# Patient Record
Sex: Male | Born: 1980 | Race: White | Hispanic: No | Smoking: Never smoker
Health system: Southern US, Community
[De-identification: ages and names within clinical notes are randomized; demographics above are authoritative.]

## PROBLEM LIST (undated history)

## (undated) DIAGNOSIS — M1990 Unspecified osteoarthritis, unspecified site: Secondary | ICD-10-CM

## (undated) DIAGNOSIS — Z87442 Personal history of urinary calculi: Secondary | ICD-10-CM

## (undated) DIAGNOSIS — J45909 Unspecified asthma, uncomplicated: Secondary | ICD-10-CM

## (undated) DIAGNOSIS — Z8489 Family history of other specified conditions: Secondary | ICD-10-CM

## (undated) HISTORY — PX: HAND EXPLORATION: SHX1725

---

## 2012-03-27 DIAGNOSIS — M25861 Other specified joint disorders, right knee: Secondary | ICD-10-CM | POA: Insufficient documentation

## 2012-08-17 ENCOUNTER — Emergency Department: Payer: Self-pay | Admitting: Internal Medicine

## 2014-06-23 DIAGNOSIS — R7989 Other specified abnormal findings of blood chemistry: Secondary | ICD-10-CM | POA: Insufficient documentation

## 2014-07-18 DIAGNOSIS — G4733 Obstructive sleep apnea (adult) (pediatric): Secondary | ICD-10-CM | POA: Insufficient documentation

## 2014-10-20 DIAGNOSIS — E7800 Pure hypercholesterolemia, unspecified: Secondary | ICD-10-CM | POA: Insufficient documentation

## 2016-06-07 ENCOUNTER — Emergency Department: Payer: Worker's Compensation

## 2016-06-07 ENCOUNTER — Emergency Department
Admission: EM | Admit: 2016-06-07 | Discharge: 2016-06-07 | Disposition: A | Discharge status: Home / Self Care | Payer: Worker's Compensation | Attending: Emergency Medicine | Admitting: Emergency Medicine

## 2016-06-07 ENCOUNTER — Encounter: Payer: Self-pay | Admitting: Emergency Medicine

## 2016-06-07 DIAGNOSIS — J45909 Unspecified asthma, uncomplicated: Secondary | ICD-10-CM | POA: Insufficient documentation

## 2016-06-07 DIAGNOSIS — Y990 Civilian activity done for income or pay: Secondary | ICD-10-CM | POA: Diagnosis not present

## 2016-06-07 DIAGNOSIS — S61411A Laceration without foreign body of right hand, initial encounter: Secondary | ICD-10-CM | POA: Insufficient documentation

## 2016-06-07 DIAGNOSIS — Y929 Unspecified place or not applicable: Secondary | ICD-10-CM | POA: Insufficient documentation

## 2016-06-07 DIAGNOSIS — Z23 Encounter for immunization: Secondary | ICD-10-CM | POA: Insufficient documentation

## 2016-06-07 DIAGNOSIS — Y9389 Activity, other specified: Secondary | ICD-10-CM | POA: Diagnosis not present

## 2016-06-07 DIAGNOSIS — S6991XA Unspecified injury of right wrist, hand and finger(s), initial encounter: Secondary | ICD-10-CM | POA: Diagnosis present

## 2016-06-07 DIAGNOSIS — Z87891 Personal history of nicotine dependence: Secondary | ICD-10-CM | POA: Diagnosis not present

## 2016-06-07 DIAGNOSIS — W11XXXA Fall on and from ladder, initial encounter: Secondary | ICD-10-CM | POA: Diagnosis not present

## 2016-06-07 HISTORY — DX: Unspecified asthma, uncomplicated: J45.909

## 2016-06-07 MED ORDER — LIDOCAINE HCL 2 % IJ SOLN: 20.0000 mL | Freq: Once | INTRAMUSCULAR | Status: DC

## 2016-06-07 MED ORDER — LIDOCAINE HCL (PF) 1 % IJ SOLN
20.0000 mL | Freq: Once | INTRAMUSCULAR | Status: DC
Start: 2016-06-07 — End: 2016-06-07

## 2016-06-07 MED ORDER — CEPHALEXIN 500 MG PO CAPS: 1000.0000 mg | ORAL_CAPSULE | Freq: Two times a day (BID) | ORAL | 0 refills | Status: AC

## 2016-06-07 MED ORDER — LIDOCAINE HCL (PF) 1 % IJ SOLN
INTRAMUSCULAR | Status: AC
  Filled 2016-06-07: qty 20

## 2016-06-07 MED ORDER — TETANUS-DIPHTH-ACELL PERTUSSIS 5-2.5-18.5 LF-MCG/0.5 IM SUSP
0.5000 mL | Freq: Once | INTRAMUSCULAR | Status: AC
  Administered 2016-06-07: 0.5 mL via INTRAMUSCULAR
  Filled 2016-06-07: qty 0.5

## 2016-06-07 MED ORDER — HYDROCODONE-ACETAMINOPHEN 5-325 MG PO TABS
2.0000 | ORAL_TABLET | Freq: Once | ORAL | Status: AC
  Administered 2016-06-07: 2 via ORAL
  Filled 2016-06-07: qty 2

## 2016-06-07 MED ORDER — HYDROCODONE-ACETAMINOPHEN 5-325 MG PO TABS: 1.0000 | ORAL_TABLET | Freq: Four times a day (QID) | ORAL | 0 refills | Status: DC | PRN

## 2016-06-07 NOTE — Discharge Instructions (Signed)
Take motrin for pain.   Take vicodin for severe pain.   Take keflex to prevent infection.   Suture removal in a week   Return to ER if you have severe hand pain, hand redness and swelling, fingers turning blue, fevers, purulent drainage

## 2016-06-07 NOTE — ED Provider Notes (Signed)
[facility]-EMERGENCY DEPARTMENT Provider Note   CSN: [id number] Arrival date & time: 06/07/16  0945     History   Chief Complaint Chief Complaint  Patient presents with  . Laceration    HPI Adam Yeldell. is a 36 y.o. male was healthy here presenting with laceration. Patient states that he was at work and was on the ladder and accidentally fell and his right hand went through the fish tank. He is noted to have a large laceration of his right hand afterwards and the bleeding was controlled with some pressure. Patient denies any numbness to the fingers. Patient states that his tetanus is not up-to-date.  The history is provided by the patient.    Past Medical History:  Diagnosis Date  . Asthma     There are no active problems to display for this patient.   History reviewed. No pertinent surgical history.     Home Medications    Prior to Admission medications   Not on File    Family History History reviewed. No pertinent family history.  Social History Social History  Substance Use Topics  . Smoking status: Never Smoker  . Smokeless tobacco: Former Neurosurgeon    Types: Chew    Quit date: 02/28/2011  . Alcohol use Yes     Comment: OCCASional     Allergies   Patient has no known allergies.   Review of Systems Review of Systems  Skin: Positive for wound.  All other systems reviewed and are negative.    Physical Exam Updated Vital Signs BP (!) 145/86 (BP Location: Left Arm)   Pulse 96   Temp 97.9 F (36.6 C) (Oral)   Resp 18   Ht 6' (1.829 m)   Wt 190 lb (86.2 kg)   SpO2 98%   BMI 25.77 kg/m   Physical Exam  Constitutional: He appears well-developed.  HENT:  Head: Normocephalic.  Eyes: Pupils are equal, round, and reactive to light.  Neck: Normal range of motion.  Cardiovascular: Normal rate.   Pulmonary/Chest: Effort normal.  Abdominal: Soft.  Musculoskeletal:  10 cm laceration palmer side of R hand on the R 5th metacarpal bone. 8 cm  laceration dorsal side L hand R 5th metacarpal bone. There is fat exposed, no obvious tendons. Able to flex and extend R 5th finger, nl capillary refill, no obvious bony tenderness. Nl ROM R wrist, 2+ radial pulses   Neurological: He is alert.  Skin: Skin is warm.  Psychiatric: He has a normal mood and affect.  Nursing note and vitals reviewed.    ED Treatments / Results  Labs (all labs ordered are listed, but only abnormal results are displayed) Labs Reviewed - No data to display  EKG  EKG Interpretation None       Radiology Dg Hand Complete Right  Result Date: 06/07/2016 CLINICAL DATA:  Laceration to right hand along 5th metacarpal after falling onto glass fish tank today. Hand was wrapped in gauze by RN to control bleeding. EXAM: RIGHT HAND - COMPLETE 3+ VIEW COMPARISON:  None. FINDINGS: Three views of the right hand submitted. There is soft tissue irregularity and bandage artifact probable laceration in the region of fifth metacarpal. No definite acute fracture or subluxation. Minimal deformity midshaft of right fifth metacarpal question prior injury. Clinical correlation is necessary. IMPRESSION: There is soft tissue irregularity and bandage artifact probable laceration in the region of fifth metacarpal. No definite acute fracture or subluxation. Minimal deformity midshaft of right fifth metacarpal question prior injury.  Clinical correlation is necessary. Electronically Signed   By: Natasha Mead M.D.   On: 06/07/2016 10:40    Procedures Procedures (including critical care time)  LACERATION REPAIR Performed by: Richardean Canal Authorized by: Richardean Canal Consent: Verbal consent obtained. Risks and benefits: risks, benefits and alternatives were discussed Consent given by: patient Patient identity confirmed: provided demographic data Prepped and Draped in normal sterile fashion Wound explored  Laceration Location: dorsal aspect R hand   Laceration Length: 8 cm  No Foreign  Bodies seen or palpated  Anesthesia: local infiltration  Local anesthetic: lidocaine 1% no epinephrine  Anesthetic total: 10 ml  Irrigation method: syringe Amount of cleaning: standard  Skin closure: multiple layers  Number of sutures: 3 4-0 monocryl subcutaneous and 8 4-0 ethilon simple interrupted   Technique:multi layer closure   Patient tolerance: Patient tolerated the procedure well with no immediate complications.  LACERATION REPAIR Performed by: Richardean Canal Authorized by: Richardean Canal Consent: Verbal consent obtained. Risks and benefits: risks, benefits and alternatives were discussed Consent given by: patient Patient identity confirmed: provided demographic data Prepped and Draped in normal sterile fashion Wound explored  Laceration Location: palmer R hand   Laceration Length: 10 cm  No Foreign Bodies seen or palpated  Anesthesia: local infiltration  Local anesthetic: lidocaine 1 % no epinephrine  Anesthetic total: 10 ml  Irrigation method: syringe Amount of cleaning: standard  Skin closure: multi layer  Number of sutures: 4 4-0 monocryl subcutaneous, 7 4-0 ethilon simple interrupted   Technique: multi layer closure   Patient tolerance: Patient tolerated the procedure well with no immediate complications.    Medications Ordered in ED Medications  lidocaine (PF) (XYLOCAINE) 1 % injection 20 mL (not administered)  lidocaine (PF) (XYLOCAINE) 1 % injection (not administered)  Tdap (BOOSTRIX) injection 0.5 mL (0.5 mLs Intramuscular Given 06/07/16 1003)  HYDROcodone-acetaminophen (NORCO/VICODIN) 5-325 MG per tablet 2 tablet (2 tablets Oral Given 06/07/16 1002)     Initial Impression / Assessment and Plan / ED Course  I have reviewed the triage vital signs and the nursing notes.  Pertinent labs & imaging results that were available during my care of the patient were reviewed by me and considered in my medical decision making (see chart for details).      Adam Borgen. is a 36 y.o. male here with R hand laceration. Updated tdap.xray showed no acute fracture or foreign body. Irrigated wound extensively, no tendons visualized, neurovascular intact. I performed multi layer closure- subcutaneous and simple interrupted. Will dc home with keflex, vicodin for pain. Suture removal in a week.     Final Clinical Impressions(s) / ED Diagnoses   Final diagnoses:  None    New Prescriptions New Prescriptions   No medications on file     Charlynne Pander, MD 06/07/16 1206

## 2016-06-07 NOTE — ED Triage Notes (Signed)
Pt from work with laceration on right hand. Pt states he fell from a ladder and fell on an aquarium, resulting in laceration to palmar and dorsal aspects of right hand. Pt alert & oriented; denies pain, LOC. Nurse at work applied pressure and bleeding is controlled. NAD noted.

## 2016-06-07 NOTE — ED Notes (Signed)
Right hand cleaned with normal saline clean dressing applied

## 2017-01-26 ENCOUNTER — Encounter: Payer: Self-pay | Admitting: Urology

## 2017-01-26 ENCOUNTER — Telehealth: Payer: Self-pay | Admitting: Urology

## 2017-01-26 ENCOUNTER — Ambulatory Visit: Payer: BLUE CROSS/BLUE SHIELD | Admitting: Urology

## 2017-01-26 VITALS — BP 133/79 | HR 87 | Ht 72.0 in | Wt 191.4 lb

## 2017-01-26 DIAGNOSIS — E291 Testicular hypofunction: Secondary | ICD-10-CM | POA: Diagnosis not present

## 2017-01-26 NOTE — Telephone Encounter (Signed)
Patient needs to be approved for testopel and then once that's done get scheduled for it.   Thanks, Adam DusterMichelle

## 2017-01-26 NOTE — Progress Notes (Signed)
01/26/2017 1:00 PM   Adam ModenaJerry C Gange Jr. [birth year] [id number]   Chief Complaint  Patient presents with  . Low Testosterone    HPI: Adam Glenn is a 36 y.o. male seen for hypogonadism.  Adam Glenn was initially diagnosed in 2015 and started on testosterone injections by Dr. Burnett ShengHedrick.  Adam Glenn was subsequently [facility] and subsequently [facility] and was on testosterone injections.  Adam Glenn transferred his care to Dr. Sheppard PentonWolf and was undergoing testosterone pellet injections every 5 months with 1500 mg and testosterone levels in the 500-700 range.  Adam Glenn has elected to transfer care here and wants to schedule a repeat Testopel injection.  Adam Glenn has mild lower urinary tract symptoms.  Adam Glenn has a history of Peyronie's disease and has previously received Xiaflex without improvement though his curvature does not prohibit intercourse.   PMH: Past Medical History:  Diagnosis Date  . Asthma     Surgical History: History reviewed. No pertinent surgical history.  Home Medications:  Allergies as of 01/26/2017   No Known Allergies     Medication List        Accurate as of 01/26/17  1:00 PM. Always use your most recent med list.          sildenafil 100 MG tablet Commonly known as:  VIAGRA Take 100 mg by mouth daily as needed for erectile dysfunction.   simvastatin 40 MG tablet Commonly known as:  ZOCOR Take 1 tablet by mouth daily.   testosterone cypionate 200 MG/ML injection Commonly known as:  DEPOTESTOSTERONE CYPIONATE Inject into the muscle every 14 (fourteen) days.   VITAMIN E PO Take by mouth.       Allergies: No Known Allergies  Family History: History reviewed. No pertinent family history.  Social History:  reports that  has never smoked. Adam Glenn quit smokeless tobacco use about 5 years ago. His smokeless tobacco use included chew. Adam Glenn reports that Adam Glenn drinks alcohol. Adam Glenn reports that Adam Glenn does not use drugs.  ROS: UROLOGY Frequent  Urination?: No Hard to postpone urination?: No Burning/pain with urination?: No Get up at night to urinate?: Yes Leakage of urine?: No Urine stream starts and stops?: No Trouble starting stream?: No Do you have to strain to urinate?: No Blood in urine?: No Urinary tract infection?: No Sexually transmitted disease?: No Injury to kidneys or bladder?: No Painful intercourse?: Yes Weak stream?: No Erection problems?: Yes Penile pain?: Yes  Gastrointestinal Nausea?: No Vomiting?: No Indigestion/heartburn?: No Diarrhea?: No Constipation?: No  Constitutional Fever: No Night sweats?: No Weight loss?: No Fatigue?: No  Skin Skin rash/lesions?: No Itching?: No  Eyes Blurred vision?: No Double vision?: No  Ears/Nose/Throat Sore throat?: No Sinus problems?: No  Hematologic/Lymphatic Swollen glands?: No Easy bruising?: No  Cardiovascular Leg swelling?: No Chest pain?: No  Respiratory Cough?: No Shortness of breath?: No  Endocrine Excessive thirst?: No  Musculoskeletal Back pain?: No Joint pain?: No  Neurological Headaches?: No Dizziness?: No  Psychologic Depression?: No Anxiety?: No  Physical Exam: BP 133/79 (BP Location: Right Arm, Patient Position: Sitting, Cuff Size: Normal)   Pulse 87   Ht 6' (1.829 m)   Wt 191 lb 6.4 oz (86.8 kg)   BMI 25.96 kg/m   Constitutional:  Alert and oriented, No acute distress. HEENT: [place] AT, moist mucus membranes.  Trachea midline, no masses. Cardiovascular: No clubbing, cyanosis, or edema. Respiratory: Normal respiratory effort, no increased work of breathing. GI: Abdomen is soft, nontender, nondistended, no abdominal masses  GU: No CVA tenderness.  Skin: No rashes, bruises or suspicious lesions. Lymph: No cervical or inguinal adenopathy. Neurologic: Grossly intact, no focal deficits, moving all 4 extremities. Psychiatric: Normal mood and affect.   Assessment & Plan:    1.  Hypogonadism Will schedule follow-up  Testopel implant.  Adam Glenn was informed that most of insurance approvals are for 450 mg.  Adam Glenn states Adam Glenn has previously been approved for 1500 mg by his insurance company and was informed this would need to be approved again before the procedure.    Riki AltesScott C Tashanna Dolin, MD  [facility] [address], [address] [place], [place] [address] [phone]

## 2017-01-28 DIAGNOSIS — E291 Testicular hypofunction: Secondary | ICD-10-CM | POA: Insufficient documentation

## 2017-02-02 ENCOUNTER — Encounter: Payer: Self-pay | Admitting: Urology

## 2017-02-02 ENCOUNTER — Ambulatory Visit (INDEPENDENT_AMBULATORY_CARE_PROVIDER_SITE_OTHER): Payer: BLUE CROSS/BLUE SHIELD | Admitting: Urology

## 2017-02-02 VITALS — BP 168/69 | HR 97 | Ht 72.0 in | Wt 190.5 lb

## 2017-02-02 DIAGNOSIS — E291 Testicular hypofunction: Secondary | ICD-10-CM | POA: Diagnosis not present

## 2017-02-02 MED ORDER — TESTOSTERONE 75 MG IL PLLT
75.0000 mg | PELLET | Freq: Once | Status: AC
  Administered 2017-02-02: 75 mg

## 2017-02-06 NOTE — Progress Notes (Signed)
This is a 36 -year-old male with hypogonadism and he is managed with Testopel. He presents today for Testopel insertion.  Patient is placed on the exam table in the left lateral decubitus position.  Identified upper outer quadrant of hip for insertion; prepped area with Betadine and injected 8 cc's of Lidocaine 1% with Epinephrine to anesthetize superficially and distally along trocar tract.  Made 3 mm incision using 15 blade of scalpel; trocar with sharp ended stylet was inserted into subcutaneous tissue in line with femur. Sharp stylet was withdrawn and 5 pellets X 3 rows were placed into trocar well. Testopel pellets advanced into tissue using blunt ended stylet. Trocar removed and incision closed using Steri-Strips.  The site was dressed with gauze and a Tegaderm.  Careful inspection of insertion is done and patient informed of post procedure instructions.  Advised patient to apply ice to the site for 20-30 minutes every hour if needed.  Avoid hot tubes, swimming or full water immersion of the insertion site for 72 hours.  The Tegaderm may be removed in 48 hours  Patient is advised to contact the office if experiencing drainage of the insertion site, excessive redness or swelling of the site, chills and/or fevers > 101.5, nausea or vomiting, dizziness or lightheadedness and excessive tenderness.  Avoid strenuous activity and heavy lifting for 72 hours.     He will return in three month for serum testosterone.

## 2017-03-12 ENCOUNTER — Other Ambulatory Visit: Payer: Self-pay

## 2017-04-12 ENCOUNTER — Telehealth: Payer: Self-pay | Admitting: Urology

## 2017-04-12 NOTE — Telephone Encounter (Signed)
Pt's wife called and wants to know if he can come in sooner than 3/7 to have labs checked for low t.  He is not doing well.  Please call [phone]

## 2017-04-13 ENCOUNTER — Telehealth: Payer: Self-pay

## 2017-04-13 DIAGNOSIS — E349 Endocrine disorder, unspecified: Secondary | ICD-10-CM

## 2017-04-13 NOTE — Telephone Encounter (Signed)
Pt wife, Carley Hammedva, called again stating that pt is now to the point hes not sleeping at night and is really dragging. Wife inquired again about pt coming in sooner for labs. Please advise.

## 2017-04-13 NOTE — Telephone Encounter (Signed)
Okay to check testosterone level

## 2017-04-13 NOTE — Telephone Encounter (Signed)
Made wife aware that pt received 15 pellets therefore he should wait the full 65mo as Dr. Lonna CobbStoioff dictated. Wife voiced understanding.

## 2017-04-16 NOTE — Telephone Encounter (Signed)
Angie made pt appt.

## 2017-04-17 ENCOUNTER — Other Ambulatory Visit: Payer: BLUE CROSS/BLUE SHIELD

## 2017-04-17 DIAGNOSIS — E349 Endocrine disorder, unspecified: Secondary | ICD-10-CM

## 2017-04-18 LAB — TESTOSTERONE: Testosterone: 552 ng/dL (ref 264–916)

## 2017-04-23 ENCOUNTER — Telehealth: Payer: Self-pay | Admitting: Urology

## 2017-04-23 NOTE — Telephone Encounter (Signed)
Spoke with patient's wife apps made  [place]

## 2017-04-23 NOTE — Telephone Encounter (Signed)
[facility]  Please call wife cell, okay to leave detailed message on VM. [phone].  Please advise. Thanks.

## 2017-04-25 ENCOUNTER — Telehealth: Payer: Self-pay

## 2017-04-25 NOTE — Telephone Encounter (Signed)
-----   Message from Riki AltesScott C Stoioff, MD sent at 04/22/2017  9:39 AM EST ----- Testosterone level was normal at 552.  Can tentatively schedule repeat Testopel in 6 weeks.  Repeat testosterone level in 1 month.

## 2017-04-25 NOTE — Telephone Encounter (Signed)
Marcelino DusterMichelle spoke with pt and appts were made.

## 2017-05-03 ENCOUNTER — Other Ambulatory Visit: Payer: BLUE CROSS/BLUE SHIELD

## 2017-05-22 ENCOUNTER — Other Ambulatory Visit: Payer: BLUE CROSS/BLUE SHIELD

## 2017-05-23 ENCOUNTER — Other Ambulatory Visit: Payer: Self-pay

## 2017-05-23 DIAGNOSIS — E349 Endocrine disorder, unspecified: Secondary | ICD-10-CM

## 2017-05-24 ENCOUNTER — Other Ambulatory Visit: Payer: BLUE CROSS/BLUE SHIELD

## 2017-05-24 DIAGNOSIS — E349 Endocrine disorder, unspecified: Secondary | ICD-10-CM

## 2017-05-25 LAB — TESTOSTERONE: Testosterone: 189 ng/dL — ABNORMAL LOW (ref 264–916)

## 2017-05-28 ENCOUNTER — Telehealth: Payer: Self-pay

## 2017-05-28 NOTE — Telephone Encounter (Signed)
-----   Message from Riki AltesScott C Stoioff, MD sent at 05/25/2017 12:23 PM EDT ----- Testosterone level was 189.  He is scheduled for Testopel replacement on 4/9.

## 2017-05-28 NOTE — Telephone Encounter (Signed)
Patient notified

## 2017-06-04 ENCOUNTER — Ambulatory Visit: Payer: BLUE CROSS/BLUE SHIELD | Admitting: Urology

## 2017-06-05 ENCOUNTER — Ambulatory Visit: Payer: BLUE CROSS/BLUE SHIELD | Admitting: Urology

## 2017-06-05 ENCOUNTER — Encounter: Payer: Self-pay | Admitting: Urology

## 2017-06-05 VITALS — BP 128/79 | HR 76 | Ht 72.0 in | Wt 189.2 lb

## 2017-06-05 DIAGNOSIS — E291 Testicular hypofunction: Secondary | ICD-10-CM | POA: Diagnosis not present

## 2017-06-05 NOTE — Progress Notes (Signed)
Procedure note: Testopel  Diagnosis: Hypogonadism  Physician: Riki AltesScott C Stoioff, MD  Assistant:   Anesthetic: 1% plain Xylocaine with epinephrine  Description: A site on the upper outer right buttock was selected and prepped with Betadine and draped in the usual fashion.  The site was anesthetized with 8 mL of the above anesthetic in a fan shape and configuration.  A stab incision was made with 11 blade and spread with forceps.  Using the implantation trocar 5 pellets were placed in 3 rows.  The site was dressed with Steri-Strips, gauze and a Tegaderm.   Body weight pressure was placed for 5 minutes.  Complications: None  EBL: Minimal  Discharge instructions: 1.  May shower in 24 hours.  No swimming, tub bath X 1 week. 2.  Call for redness, drainage, increasing pain or fever greater than 101 degrees. 3.  Follow-up in 4 months for repeat Testopel with a PSA, hematocrit, testosterone level in 3 months.

## 2017-09-05 ENCOUNTER — Other Ambulatory Visit: Payer: BLUE CROSS/BLUE SHIELD

## 2017-09-05 DIAGNOSIS — E291 Testicular hypofunction: Secondary | ICD-10-CM

## 2017-09-05 DIAGNOSIS — E785 Hyperlipidemia, unspecified: Secondary | ICD-10-CM

## 2017-09-06 ENCOUNTER — Telehealth: Payer: Self-pay | Admitting: Family Medicine

## 2017-09-06 LAB — LIPID PANEL
CHOL/HDL RATIO: 5.2 ratio — AB (ref 0.0–5.0)
CHOLESTEROL TOTAL: 220 mg/dL — AB (ref 100–199)
HDL: 42 mg/dL (ref >39)
LDL CALC: 164 mg/dL — AB (ref 0–99)
TRIGLYCERIDES: 70 mg/dL (ref 0–149)
VLDL CHOLESTEROL CAL: 14 mg/dL (ref 5–40)

## 2017-09-06 LAB — BASIC METABOLIC PANEL
BUN / CREAT RATIO: 13 (ref 9–20)
BUN: 16 mg/dL (ref 6–20)
CHLORIDE: 102 mmol/L (ref 96–106)
CO2: 25 mmol/L (ref 20–29)
Calcium: 9.6 mg/dL (ref 8.7–10.2)
Creatinine, Ser: 1.25 mg/dL (ref 0.76–1.27)
GFR, EST AFRICAN AMERICAN: 84 mL/min/{1.73_m2} (ref >59)
GFR, EST NON AFRICAN AMERICAN: 73 mL/min/{1.73_m2} (ref >59)
Glucose: 94 mg/dL (ref 65–99)
POTASSIUM: 4.9 mmol/L (ref 3.5–5.2)
SODIUM: 142 mmol/L (ref 134–144)

## 2017-09-06 LAB — PSA: Prostate Specific Ag, Serum: 1.6 ng/mL (ref 0.0–4.0)

## 2017-09-06 LAB — TESTOSTERONE: TESTOSTERONE: 317 ng/dL (ref 264–916)

## 2017-09-06 LAB — HEMATOCRIT: Hematocrit: 47.7 % (ref 37.5–51.0)

## 2017-09-06 NOTE — Telephone Encounter (Signed)
Patient notified

## 2017-09-06 NOTE — Telephone Encounter (Signed)
-----   Message from Riki AltesScott C Stoioff, MD sent at 09/06/2017  8:27 AM EDT ----- Testosterone level was 317.  PSA normal at 1.6.  Hematocrit normal at 47.7. Keep f/u as sched

## 2017-10-03 ENCOUNTER — Encounter: Payer: Self-pay | Admitting: Urology

## 2017-10-03 ENCOUNTER — Ambulatory Visit: Payer: BLUE CROSS/BLUE SHIELD | Admitting: Urology

## 2017-10-03 VITALS — BP 135/89 | HR 77 | Ht 72.0 in | Wt 189.2 lb

## 2017-10-03 DIAGNOSIS — E291 Testicular hypofunction: Secondary | ICD-10-CM

## 2017-10-03 NOTE — Progress Notes (Signed)
Procedure note: Testopel  Diagnosis: Hypogonadism  Physician: Riki AltesScott C Stoioff, MD  Assistant: Debbe Baleskinawa Glanton, CMA  Anesthetic: 1% plain Xylocaine with epinephrine  Description: A site on the upper outer left buttock was selected and prepped with Betadine and draped in the usual fashion.  The site was anesthetized with 10 mL of the above anesthetic in a fan shaped configuration.  A stab incision was made with 11 blade and spread with forceps.  Using the implantation trocar 5 pellets were placed in 3 rows.  The site was dressed with Steri-Strips, gauze and a Tegaderm.   Body weight pressure was placed for 5 minutes.  Complications: None  EBL: Minimal  Discharge instructions: 1.  May shower in 24 hours.  No swimming, tub bath X 1 week. 2.  Call for redness, drainage, increasing pain or fever greater than 101 degrees. 3.  Follow-up testosterone level and hematocrit in 3 months and tentative repeat Testopel in 4 months.   Irineo AxonScott Stoioff, MD

## 2018-01-02 ENCOUNTER — Other Ambulatory Visit: Payer: Self-pay

## 2018-01-02 DIAGNOSIS — E349 Endocrine disorder, unspecified: Secondary | ICD-10-CM

## 2018-01-02 DIAGNOSIS — E291 Testicular hypofunction: Secondary | ICD-10-CM

## 2018-01-03 ENCOUNTER — Other Ambulatory Visit: Payer: BLUE CROSS/BLUE SHIELD

## 2018-01-03 DIAGNOSIS — E291 Testicular hypofunction: Secondary | ICD-10-CM

## 2018-01-03 DIAGNOSIS — E349 Endocrine disorder, unspecified: Secondary | ICD-10-CM

## 2018-01-04 LAB — TESTOSTERONE: Testosterone: 272 ng/dL (ref 264–916)

## 2018-01-04 LAB — HEMATOCRIT: Hematocrit: 46.4 % (ref 37.5–51.0)

## 2018-01-08 ENCOUNTER — Telehealth: Payer: Self-pay

## 2018-01-08 NOTE — Telephone Encounter (Signed)
Called pt informed him of the information below. Pt gave verbal understanding.  

## 2018-01-08 NOTE — Telephone Encounter (Signed)
-----   Message from Riki AltesScott C Stoioff, MD sent at 01/07/2018  9:08 AM EST ----- Testosterone level low normal at 272.  Keep scheduled appointment for Testopel

## 2018-01-14 DIAGNOSIS — G2589 Other specified extrapyramidal and movement disorders: Secondary | ICD-10-CM | POA: Insufficient documentation

## 2018-01-14 DIAGNOSIS — M25511 Pain in right shoulder: Secondary | ICD-10-CM | POA: Insufficient documentation

## 2018-02-06 ENCOUNTER — Ambulatory Visit: Payer: BLUE CROSS/BLUE SHIELD | Admitting: Urology

## 2018-02-06 ENCOUNTER — Encounter: Payer: Self-pay | Admitting: Urology

## 2018-02-06 VITALS — BP 131/82 | HR 89 | Ht 72.0 in | Wt 190.2 lb

## 2018-02-06 DIAGNOSIS — E291 Testicular hypofunction: Secondary | ICD-10-CM | POA: Diagnosis not present

## 2018-02-06 MED ORDER — TESTOSTERONE 75 MG IL PLLT
75.0000 mg | PELLET | Freq: Once | Status: AC
  Administered 2018-02-06: 75 mg

## 2018-02-06 NOTE — Progress Notes (Signed)
Procedure note: Testopel  Diagnosis: Hypogonadism  Physician: Riki AltesScott C Stoioff, MD  Assistant: Debbe Baleskinawa Glanton, CMA  Anesthetic: 1% plain Xylocaine with epinephrine  Description: A site on the upper outer right buttock was selected and prepped with Betadine and draped in the usual fashion.  The site was anesthetized with 6 mL of the above anesthetic in a fan shaped and configuration.  A stab incision was made with 11 blade and spread with forceps.  Using the implantation trocar 5 pellets were placed in 3 rows.  The site was dressed with Steri-Strips, gauze and a Tegaderm.   Body weight pressure was placed for 5 minutes.  Complications: None  EBL: Minimal  Discharge instructions: 1.  May shower in 24 hours.  No swimming, tub bath X 1 week. 2.  Call for redness, drainage, increasing pain or fever greater than 101 degrees. 3.  Follow-up 4 months for repeat Testopel with testosterone level 7-10 days prior   Irineo AxonScott Stoioff, MD

## 2018-05-12 ENCOUNTER — Emergency Department: Payer: Self-pay

## 2018-05-12 ENCOUNTER — Emergency Department
Admission: EM | Admit: 2018-05-12 | Discharge: 2018-05-12 | Disposition: A | Discharge status: Home / Self Care | Payer: Self-pay | Attending: Emergency Medicine | Admitting: Emergency Medicine

## 2018-05-12 ENCOUNTER — Encounter: Payer: Self-pay | Admitting: Emergency Medicine

## 2018-05-12 ENCOUNTER — Other Ambulatory Visit: Payer: Self-pay

## 2018-05-12 DIAGNOSIS — S42031A Displaced fracture of lateral end of right clavicle, initial encounter for closed fracture: Secondary | ICD-10-CM | POA: Insufficient documentation

## 2018-05-12 DIAGNOSIS — S80811A Abrasion, right lower leg, initial encounter: Secondary | ICD-10-CM | POA: Insufficient documentation

## 2018-05-12 DIAGNOSIS — Y9389 Activity, other specified: Secondary | ICD-10-CM | POA: Insufficient documentation

## 2018-05-12 DIAGNOSIS — Y92410 Unspecified street and highway as the place of occurrence of the external cause: Secondary | ICD-10-CM | POA: Insufficient documentation

## 2018-05-12 DIAGNOSIS — J45909 Unspecified asthma, uncomplicated: Secondary | ICD-10-CM | POA: Insufficient documentation

## 2018-05-12 DIAGNOSIS — Z79899 Other long term (current) drug therapy: Secondary | ICD-10-CM | POA: Insufficient documentation

## 2018-05-12 DIAGNOSIS — Y999 Unspecified external cause status: Secondary | ICD-10-CM | POA: Insufficient documentation

## 2018-05-12 DIAGNOSIS — T07XXXA Unspecified multiple injuries, initial encounter: Secondary | ICD-10-CM

## 2018-05-12 DIAGNOSIS — Z87891 Personal history of nicotine dependence: Secondary | ICD-10-CM | POA: Insufficient documentation

## 2018-05-12 DIAGNOSIS — S80212A Abrasion, left knee, initial encounter: Secondary | ICD-10-CM | POA: Insufficient documentation

## 2018-05-12 DIAGNOSIS — S60511A Abrasion of right hand, initial encounter: Secondary | ICD-10-CM | POA: Insufficient documentation

## 2018-05-12 DIAGNOSIS — S80211A Abrasion, right knee, initial encounter: Secondary | ICD-10-CM | POA: Insufficient documentation

## 2018-05-12 LAB — COMPREHENSIVE METABOLIC PANEL
ALT: 34 U/L (ref 0–44)
AST: 25 U/L (ref 15–41)
Albumin: 4.6 g/dL (ref 3.5–5.0)
Alkaline Phosphatase: 56 U/L (ref 38–126)
Anion gap: 8 (ref 5–15)
BILIRUBIN TOTAL: 0.4 mg/dL (ref 0.3–1.2)
BUN: 19 mg/dL (ref 6–20)
CHLORIDE: 103 mmol/L (ref 98–111)
CO2: 25 mmol/L (ref 22–32)
Calcium: 9 mg/dL (ref 8.9–10.3)
Creatinine, Ser: 0.93 mg/dL (ref 0.61–1.24)
GFR calc Af Amer: >60 mL/min (ref >60)
Glucose, Bld: 111 mg/dL — ABNORMAL HIGH (ref 70–99)
POTASSIUM: 4 mmol/L (ref 3.5–5.1)
Sodium: 136 mmol/L (ref 135–145)
Total Protein: 7.5 g/dL (ref 6.5–8.1)

## 2018-05-12 LAB — CBC WITH DIFFERENTIAL/PLATELET
Abs Immature Granulocytes: 0.04 10*3/uL (ref 0.00–0.07)
BASOS ABS: 0.1 10*3/uL (ref 0.0–0.1)
Basophils Relative: 1 %
EOS ABS: 0.2 10*3/uL (ref 0.0–0.5)
EOS PCT: 1 %
HEMATOCRIT: 43 % (ref 39.0–52.0)
Hemoglobin: 14.3 g/dL (ref 13.0–17.0)
IMMATURE GRANULOCYTES: 0 %
LYMPHS ABS: 1.7 10*3/uL (ref 0.7–4.0)
LYMPHS PCT: 13 %
MCH: 29.7 pg (ref 26.0–34.0)
MCHC: 33.3 g/dL (ref 30.0–36.0)
MCV: 89.4 fL (ref 80.0–100.0)
Monocytes Absolute: 1 10*3/uL (ref 0.1–1.0)
Monocytes Relative: 7 %
NEUTROS PCT: 78 %
Neutro Abs: 9.9 10*3/uL — ABNORMAL HIGH (ref 1.7–7.7)
Platelets: 317 10*3/uL (ref 150–400)
RBC: 4.81 MIL/uL (ref 4.22–5.81)
RDW: 12.5 % (ref 11.5–15.5)
WBC: 12.8 10*3/uL — AB (ref 4.0–10.5)
nRBC: 0 % (ref 0.0–0.2)

## 2018-05-12 LAB — PROTIME-INR
INR: 1 (ref 0.8–1.2)
PROTHROMBIN TIME: 12.6 s (ref 11.4–15.2)

## 2018-05-12 LAB — ETHANOL: Alcohol, Ethyl (B): <10 mg/dL (ref <10)

## 2018-05-12 MED ORDER — DOCUSATE SODIUM 100 MG PO CAPS: 100.0000 mg | ORAL_CAPSULE | Freq: Every day | ORAL | 2 refills | Status: AC | PRN

## 2018-05-12 MED ORDER — OXYCODONE-ACETAMINOPHEN 5-325 MG PO TABS: 1.0000 | ORAL_TABLET | ORAL | 0 refills | Status: DC | PRN

## 2018-05-12 MED ORDER — IOHEXOL 300 MG/ML  SOLN
100.0000 mL | Freq: Once | INTRAMUSCULAR | Status: AC | PRN
  Administered 2018-05-12: 100 mL via INTRAVENOUS

## 2018-05-12 MED ORDER — FENTANYL CITRATE (PF) 100 MCG/2ML IJ SOLN
50.0000 ug | Freq: Once | INTRAMUSCULAR | Status: AC
  Administered 2018-05-12: 50 ug via INTRAVENOUS
  Filled 2018-05-12: qty 2

## 2018-05-12 MED ORDER — OXYCODONE-ACETAMINOPHEN 5-325 MG PO TABS
2.0000 | ORAL_TABLET | Freq: Once | ORAL | Status: AC
  Administered 2018-05-12: 2 via ORAL
  Filled 2018-05-12: qty 2

## 2018-05-12 NOTE — Discharge Instructions (Addendum)
I would use Hibiclens  or hydrogen peroxide and scrub all your abrasions well in the shower, and then coat them either with Vaseline or bacitracin, you declined further x-rays which is certainly your choice but at this time it does limit our ability to rule out other fractures even though we have low suspicion so if you have ongoing pain in the area please talk to Dr. Odis Luster about it.  Follow-up with orthopedic surgery tomorrow.  If you have increased pain shortness of breath or other concerns including numbness or tingling or other issues return to the ER.  Motorcycles are dangerous, obviously you know that, continue wearing her helmet if you elect to continue riding.  Do not drink or drive while taking Percocet.
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
## 2018-05-24 ENCOUNTER — Telehealth: Payer: Self-pay | Admitting: Urology

## 2018-05-24 NOTE — Telephone Encounter (Signed)
Noted  

## 2018-05-24 NOTE — Telephone Encounter (Signed)
Due to personal reasons the patient has decided to not proceed with testopel at this time   [facility]

## 2018-05-27 ENCOUNTER — Other Ambulatory Visit: Payer: BLUE CROSS/BLUE SHIELD

## 2018-06-05 ENCOUNTER — Ambulatory Visit: Payer: BLUE CROSS/BLUE SHIELD | Admitting: Urology

## 2019-05-21 NOTE — Progress Notes (Signed)
05/22/2019 03:15 PM  Adam Columbia Jr. 1980/11/27 [id number]  Referring provider: Maryland Pink, MD [address] [facility] [place],  [place] [address]  Chief Complaint  Patient presents with  . Hypogonadism    HPI: 39 yo male returns today for follow-up on management of hypogonadism.  -Testosterone level 272 ng/dL on 01/03/2018 - last Testopel implant on 01/2018 -Lost insurance and was unable to resume Testopel -Recently insured however does not cover Testopel -Wanted to discuss other options of Testerone replacement -Recurrent symptoms tiredness, fatigue, decreased libido  PMH: Past Medical History:  Diagnosis Date  . Asthma     Surgical History: Past Surgical History:  Procedure Laterality Date  . HAND EXPLORATION      Home Medications:  Allergies as of 05/22/2019   No Known Allergies     Medication List       Accurate as of May 22, 2019  5:32 PM. If you have any questions, ask your nurse or doctor.        oxyCODONE-acetaminophen 5-325 MG tablet Commonly known as: Percocet Take 1 tablet by mouth every 4 (four) hours as needed for severe pain.   simvastatin 40 MG tablet Commonly known as: ZOCOR Take 1 tablet by mouth daily.   testosterone cypionate 200 MG/ML injection Commonly known as: DEPOTESTOSTERONE CYPIONATE Inject 200 mg into the muscle once. Every 5 months   VITAMIN E PO Take by mouth.       Allergies: No Known Allergies  Family History: Family History  Problem Relation Age of Onset  . Prostate cancer Neg Hx   . Bladder Cancer Neg Hx   . Kidney cancer Neg Hx     Social History:  reports that he has never smoked. He quit smokeless tobacco use about 8 years ago.  His smokeless tobacco use included chew. He reports current alcohol use. He reports that he does not use drugs.   Physical Exam: BP (!) 152/91   Pulse 86   Ht 6' (1.829 m)   Wt 195 lb (88.5 kg)   BMI 26.45 kg/m   Constitutional:  Alert and oriented, No  acute distress. HEENT: [place] AT, moist mucus membranes.  Trachea midline, no masses. Cardiovascular: No clubbing, cyanosis, or edema. Respiratory: Normal respiratory effort, no increased work of breathing. Skin: No rashes, bruises or suspicious lesions. Neurologic: Grossly intact, no focal deficits, moving all 4 extremities. Psychiatric: Normal mood and affect.   Assessment & Plan:   1. Hypogonadism Potential side effects of testosterone replacement were discussed including stimulation of benign prostatic growth with lower urinary tract symptoms; erythrocytosis; edema; gynecomastia; worsening sleep apnea; venous thromboembolism; testicular atrophy and infertility. Recent studies suggesting an increased incidence of heart attack and stroke in patients taking testosterone was discussed. He was informed there is conflicting evidence regarding the impact of testosterone therapy on cardiovascular risk. The theoretical risk of growth stimulation of an undetected prostate cancer was also discussed.  He was informed that current evidence does not provide any definitive answers regarding the risks of testosterone therapy on prostate cancer and cardiovascular disease. The need for periodic monitoring of his testosterone level, PSA, hematocrit and DRE was discussed.  He had previously been on testosterone cypionate and if possible would like a different alternative treatment.  We discussed testosterone undecanoate, Xyosted and topical testosterone replacement.  Testosterone and LH will be drawn today Pt will think over replacement options and will call back   Abbie Sons, [place] [phone]  [address] [place], [place] [address] [phone]  I, Karsten Fells, am acting as a medical scribe for Dr. Irineo Axon.  I have reviewed the above documentation for accuracy and completeness, and I agree with the above.   Riki Altes, MD

## 2019-05-22 ENCOUNTER — Encounter: Payer: Self-pay | Admitting: Urology

## 2019-05-22 ENCOUNTER — Ambulatory Visit (INDEPENDENT_AMBULATORY_CARE_PROVIDER_SITE_OTHER): Payer: 59 | Admitting: Urology

## 2019-05-22 ENCOUNTER — Other Ambulatory Visit: Payer: Self-pay

## 2019-05-22 VITALS — BP 152/91 | HR 86 | Ht 72.0 in | Wt 195.0 lb

## 2019-05-22 DIAGNOSIS — E349 Endocrine disorder, unspecified: Secondary | ICD-10-CM

## 2019-05-23 ENCOUNTER — Telehealth: Payer: Self-pay

## 2019-05-23 LAB — LUTEINIZING HORMONE: LH: 6.8 m[IU]/mL (ref 1.7–8.6)

## 2019-05-23 LAB — TESTOSTERONE: Testosterone: 206 ng/dL — ABNORMAL LOW (ref 264–916)

## 2019-05-23 NOTE — Telephone Encounter (Signed)
-----   Message from Levada Schilling, [place] sent at 05/23/2019  8:21 AM EDT -----  ----- Message ----- From: Riki Altes, MD Sent: 05/23/2019   7:13 AM EDT To: Levada Schilling, CMA  Testosterone level low at 206.  LH in the upper range normal at 6.8.  We could try Clomid but it may not be effective.  We also discussed Xyosted.  He was going to think over his options for testosterone replacement and let us know

## 2019-05-23 NOTE — Telephone Encounter (Signed)
Notified patient as advised. Pt stated they wanted a little more time to think about their options for testosterone replacement. Patient stated he will give [place] a call once he decides, otherwise no questions.

## 2020-05-19 ENCOUNTER — Other Ambulatory Visit
Admission: RE | Admit: 2020-05-19 | Discharge: 2020-05-19 | Disposition: A | Discharge status: Home / Self Care | Payer: Self-pay | Source: Ambulatory Visit | Attending: Orthopedic Surgery | Admitting: Orthopedic Surgery

## 2020-05-19 ENCOUNTER — Other Ambulatory Visit: Payer: Self-pay

## 2020-05-19 ENCOUNTER — Other Ambulatory Visit: Payer: Self-pay | Admitting: Orthopedic Surgery

## 2020-05-19 DIAGNOSIS — Z01818 Encounter for other preprocedural examination: Secondary | ICD-10-CM | POA: Insufficient documentation

## 2020-05-19 HISTORY — DX: Unspecified osteoarthritis, unspecified site: M19.90

## 2020-05-19 HISTORY — DX: Family history of other specified conditions: Z84.89

## 2020-05-19 HISTORY — DX: Personal history of urinary calculi: Z87.442

## 2020-05-19 NOTE — Patient Instructions (Addendum)
Your procedure is scheduled on: 05/27/20 - Thursday Report to the Registration Desk on the 1st floor of [facility]. To find out your arrival time, please call [phone] between 1PM - 3PM on: 05/26/20 -  Wednesday  Report to [facility] on 05/25/20 for Covid testing, park and walk in from 8 am to 2 pm.  REMEMBER: Instructions that are not followed completely may result in serious medical risk, up to and including death; or upon the discretion of your surgeon and anesthesiologist your surgery may need to be rescheduled.  Do not eat food after midnight the night before surgery.  No gum chewing, lozengers or hard candies.  You may however, drink CLEAR liquids up to 2 hours before you are scheduled to arrive for your surgery. Do not drink anything within 2 hours of your scheduled arrival time.  Clear liquids include: - water  - apple juice without pulp - gatorade (not RED, PURPLE, OR BLUE) - black coffee or tea (Do NOT add milk or creamers to the coffee or tea) Do NOT drink anything that is not on this list.   TAKE THESE MEDICATIONS THE MORNING OF SURGERY WITH A SIP OF WATER: None  One week prior to surgery: Stop Anti-inflammatories (NSAIDS) such as Advil, Aleve, Ibuprofen, Motrin, Naproxen, Naprosyn and Aspirin based products such as Excedrin, Goodys Powder, BC Powder.  Stop ANY OVER THE COUNTER supplements until after surgery.  No Alcohol for 24 hours before or after surgery.  No Smoking including e-cigarettes for 24 hours prior to surgery.  No chewable tobacco products for at least 6 hours prior to surgery.  No nicotine patches on the day of surgery.  Do not use any "recreational" drugs for at least a week prior to your surgery.  Please be advised that the combination of cocaine and anesthesia may have negative outcomes, up to and including death. If you test positive for cocaine, your surgery will be cancelled.  On the morning of surgery brush your teeth with  toothpaste and water, you may rinse your mouth with mouthwash if you wish. Do not swallow any toothpaste or mouthwash.  Do not wear jewelry, make-up, hairpins, clips or nail polish.  Do not wear lotions, powders, or perfumes.   Do not shave body from the neck down 48 hours prior to surgery just in case you cut yourself which could leave a site for infection.  Also, freshly shaved skin may become irritated if using the CHG soap.  Contact lenses, hearing aids and dentures may not be worn into surgery.  Do not bring valuables to the hospital. [facility] is not responsible for any missing/lost belongings or valuables.   Use CHG Soap or wipes as directed on instruction sheet.  Notify your doctor if there is any change in your medical condition (cold, fever, infection).  Wear comfortable clothing (specific to your surgery type) to the hospital.  Plan for stool softeners for home use; pain medications have a tendency to cause constipation. You can also help prevent constipation by eating foods high in fiber such as fruits and vegetables and drinking plenty of fluids as your diet allows.  After surgery, you can help prevent lung complications by doing breathing exercises.  Take deep breaths and cough every 1-2 hours. Your doctor may order a device called an Incentive Spirometer to help you take deep breaths. When coughing or sneezing, hold a pillow firmly against your incision with both hands. This is called "splinting." Doing this helps protect your  incision. It also decreases belly discomfort.  If you are being admitted to the hospital overnight, leave your suitcase in the car. After surgery it may be brought to your room.  If you are being discharged the day of surgery, you will not be allowed to drive home. You will need a responsible adult (18 years or older) to drive you home and stay with you that night.   If you are taking public transportation, you will need to have a responsible  adult (18 years or older) with you. Please confirm with your physician that it is acceptable to use public transportation.   Please call the Pre-admissions Testing Dept. at [phone] if you have any questions about these instructions.  Surgery Visitation Policy:  Patients undergoing a surgery or procedure may have one family member or support person with them as long as that person is not COVID-19 positive or experiencing its symptoms.  That person may remain in the waiting area during the procedure.  Inpatient Visitation:    Visiting hours are 7 a.m. to 8 p.m. Inpatients will be allowed two visitors daily. The visitors may change each day during the patient's stay. No visitors under the age of 50. Any visitor under the age of 46 must be accompanied by an adult. The visitor must pass COVID-19 screenings, use hand sanitizer when entering and exiting the patient's room and wear a mask at all times, including in the patient's room. Patients must also wear a mask when staff or their visitor are in the room. Masking is required regardless of vaccination status.

## 2020-05-25 ENCOUNTER — Other Ambulatory Visit: Payer: Self-pay

## 2020-05-25 ENCOUNTER — Other Ambulatory Visit
Admission: RE | Admit: 2020-05-25 | Discharge: 2020-05-25 | Disposition: A | Discharge status: Home / Self Care | Payer: Self-pay | Source: Ambulatory Visit | Attending: Orthopedic Surgery | Admitting: Orthopedic Surgery

## 2020-05-25 DIAGNOSIS — Z20822 Contact with and (suspected) exposure to covid-19: Secondary | ICD-10-CM | POA: Insufficient documentation

## 2020-05-25 DIAGNOSIS — Z01812 Encounter for preprocedural laboratory examination: Secondary | ICD-10-CM | POA: Insufficient documentation

## 2020-05-25 LAB — BASIC METABOLIC PANEL
Anion gap: 7 (ref 5–15)
BUN: 15 mg/dL (ref 6–20)
CO2: 28 mmol/L (ref 22–32)
Calcium: 9.7 mg/dL (ref 8.9–10.3)
Chloride: 103 mmol/L (ref 98–111)
Creatinine, Ser: 1.12 mg/dL (ref 0.61–1.24)
GFR, Estimated: >60 mL/min (ref >60)
Glucose, Bld: 98 mg/dL (ref 70–99)
Potassium: 4.1 mmol/L (ref 3.5–5.1)
Sodium: 138 mmol/L (ref 135–145)

## 2020-05-25 LAB — CBC WITH DIFFERENTIAL/PLATELET
Abs Immature Granulocytes: 0.02 10*3/uL (ref 0.00–0.07)
Basophils Absolute: 0.1 10*3/uL (ref 0.0–0.1)
Basophils Relative: 1 %
Eosinophils Absolute: 0.1 10*3/uL (ref 0.0–0.5)
Eosinophils Relative: 2 %
HCT: 42.1 % (ref 39.0–52.0)
Hemoglobin: 14.2 g/dL (ref 13.0–17.0)
Immature Granulocytes: 0 %
Lymphocytes Relative: 29 %
Lymphs Abs: 1.6 10*3/uL (ref 0.7–4.0)
MCH: 28.9 pg (ref 26.0–34.0)
MCHC: 33.7 g/dL (ref 30.0–36.0)
MCV: 85.6 fL (ref 80.0–100.0)
Monocytes Absolute: 0.4 10*3/uL (ref 0.1–1.0)
Monocytes Relative: 8 %
Neutro Abs: 3.5 10*3/uL (ref 1.7–7.7)
Neutrophils Relative %: 60 %
Platelets: 288 10*3/uL (ref 150–400)
RBC: 4.92 MIL/uL (ref 4.22–5.81)
RDW: 12.6 % (ref 11.5–15.5)
WBC: 5.8 10*3/uL (ref 4.0–10.5)
nRBC: 0 % (ref 0.0–0.2)

## 2020-05-25 LAB — SARS CORONAVIRUS 2 (TAT 6-24 HRS): SARS Coronavirus 2: NEGATIVE

## 2020-05-26 MED ORDER — LACTATED RINGERS IV SOLN: INTRAVENOUS | Status: DC

## 2020-05-26 MED ORDER — CELECOXIB 200 MG PO CAPS: 200.0000 mg | ORAL_CAPSULE | ORAL | Status: AC

## 2020-05-26 MED ORDER — ORAL CARE MOUTH RINSE: 15.0000 mL | Freq: Once | OROMUCOSAL | Status: AC

## 2020-05-26 MED ORDER — CEFAZOLIN SODIUM-DEXTROSE 2-4 GM/100ML-% IV SOLN
2.0000 g | INTRAVENOUS | Status: AC
  Administered 2020-05-27: 2 g via INTRAVENOUS

## 2020-05-26 MED ORDER — FAMOTIDINE 20 MG PO TABS: 20.0000 mg | ORAL_TABLET | Freq: Once | ORAL | Status: AC

## 2020-05-26 MED ORDER — CHLORHEXIDINE GLUCONATE CLOTH 2 % EX PADS: 6.0000 | MEDICATED_PAD | Freq: Once | CUTANEOUS | Status: DC

## 2020-05-26 MED ORDER — CHLORHEXIDINE GLUCONATE 0.12 % MT SOLN: 15.0000 mL | Freq: Once | OROMUCOSAL | Status: AC

## 2020-05-26 MED ORDER — ACETAMINOPHEN 500 MG PO TABS: 1000.0000 mg | ORAL_TABLET | ORAL | Status: AC

## 2020-05-27 ENCOUNTER — Other Ambulatory Visit: Payer: Self-pay

## 2020-05-27 ENCOUNTER — Ambulatory Visit: Payer: Managed Care, Other (non HMO)

## 2020-05-27 ENCOUNTER — Ambulatory Visit: Payer: Managed Care, Other (non HMO) | Admitting: Urgent Care

## 2020-05-27 ENCOUNTER — Ambulatory Visit
Admission: RE | Admit: 2020-05-27 | Discharge: 2020-05-27 | Disposition: A | Discharge status: Home / Self Care | Payer: Managed Care, Other (non HMO) | Attending: Orthopedic Surgery | Admitting: Orthopedic Surgery

## 2020-05-27 ENCOUNTER — Encounter
Admission: RE | Disposition: A | Discharge status: Home / Self Care | Payer: Self-pay | Source: Home / Self Care | Attending: Orthopedic Surgery

## 2020-05-27 ENCOUNTER — Encounter: Payer: Self-pay | Admitting: Orthopedic Surgery

## 2020-05-27 DIAGNOSIS — W03XXXA Other fall on same level due to collision with another person, initial encounter: Secondary | ICD-10-CM | POA: Diagnosis not present

## 2020-05-27 DIAGNOSIS — S83512A Sprain of anterior cruciate ligament of left knee, initial encounter: Secondary | ICD-10-CM | POA: Diagnosis present

## 2020-05-27 DIAGNOSIS — M24662 Ankylosis, left knee: Secondary | ICD-10-CM | POA: Insufficient documentation

## 2020-05-27 DIAGNOSIS — S83282A Other tear of lateral meniscus, current injury, left knee, initial encounter: Secondary | ICD-10-CM | POA: Diagnosis not present

## 2020-05-27 HISTORY — PX: KNEE ARTHROSCOPY WITH ANTERIOR CRUCIATE LIGAMENT (ACL) REPAIR WITH HAMSTRING GRAFT: SHX5645

## 2020-05-27 HISTORY — PX: KNEE ARTHROSCOPY WITH MENISCAL REPAIR: SHX5653

## 2020-05-27 LAB — PROTIME-INR
INR: 1.1 (ref 0.8–1.2)
Prothrombin Time: 13.7 seconds (ref 11.4–15.2)

## 2020-05-27 LAB — APTT: aPTT: 28 seconds (ref 24–36)

## 2020-05-27 SURGERY — KNEE ARTHROSCOPY WITH ANTERIOR CRUCIATE LIGAMENT (ACL) REPAIR WITH HAMSTRING GRAFT
Anesthesia: General | Site: Knee | Laterality: Left

## 2020-05-27 MED ORDER — MIDAZOLAM HCL 2 MG/2ML IJ SOLN
INTRAMUSCULAR | Status: DC | PRN
  Administered 2020-05-27: 2 mg via INTRAVENOUS

## 2020-05-27 MED ORDER — FENTANYL CITRATE (PF) 100 MCG/2ML IJ SOLN
INTRAMUSCULAR | Status: AC
  Filled 2020-05-27: qty 2

## 2020-05-27 MED ORDER — ACETAMINOPHEN 500 MG PO TABS
ORAL_TABLET | ORAL | Status: AC
  Administered 2020-05-27: 1000 mg via ORAL
  Filled 2020-05-27: qty 2

## 2020-05-27 MED ORDER — CELECOXIB 200 MG PO CAPS
ORAL_CAPSULE | ORAL | Status: AC
  Administered 2020-05-27: 200 mg via ORAL
  Filled 2020-05-27: qty 1

## 2020-05-27 MED ORDER — ONDANSETRON HCL 4 MG/2ML IJ SOLN
4.0000 mg | Freq: Once | INTRAMUSCULAR | Status: AC | PRN
  Administered 2020-05-27: 4 mg via INTRAVENOUS

## 2020-05-27 MED ORDER — KETAMINE HCL 50 MG/5ML IJ SOSY
PREFILLED_SYRINGE | INTRAMUSCULAR | Status: AC
  Filled 2020-05-27: qty 5

## 2020-05-27 MED ORDER — ASPIRIN EC 325 MG PO TBEC: 325.0000 mg | DELAYED_RELEASE_TABLET | Freq: Every day | ORAL | 0 refills | Status: AC

## 2020-05-27 MED ORDER — OXYCODONE HCL 5 MG PO TABS
ORAL_TABLET | ORAL | Status: AC
  Filled 2020-05-27: qty 1

## 2020-05-27 MED ORDER — DEXAMETHASONE SODIUM PHOSPHATE 10 MG/ML IJ SOLN
INTRAMUSCULAR | Status: DC | PRN
  Administered 2020-05-27: 10 mg via INTRAVENOUS

## 2020-05-27 MED ORDER — MIDAZOLAM HCL 2 MG/2ML IJ SOLN
INTRAMUSCULAR | Status: AC
  Filled 2020-05-27: qty 2

## 2020-05-27 MED ORDER — ONDANSETRON HCL 4 MG/2ML IJ SOLN
INTRAMUSCULAR | Status: DC | PRN
  Administered 2020-05-27: 4 mg via INTRAVENOUS

## 2020-05-27 MED ORDER — DEXMEDETOMIDINE HCL 200 MCG/2ML IV SOLN
INTRAVENOUS | Status: DC | PRN
  Administered 2020-05-27: 4 ug via INTRAVENOUS
  Administered 2020-05-27: 8 ug via INTRAVENOUS
  Administered 2020-05-27: 4 ug via INTRAVENOUS

## 2020-05-27 MED ORDER — BUPIVACAINE HCL (PF) 0.25 % IJ SOLN
INTRAMUSCULAR | Status: AC
  Filled 2020-05-27: qty 30

## 2020-05-27 MED ORDER — BUPIVACAINE HCL (PF) 0.25 % IJ SOLN
INTRAMUSCULAR | Status: DC | PRN
  Administered 2020-05-27: 30 mL

## 2020-05-27 MED ORDER — HYDROMORPHONE HCL 1 MG/ML IJ SOLN
0.5000 mg | INTRAMUSCULAR | Status: AC | PRN
  Administered 2020-05-27 (×3): 0.5 mg via INTRAVENOUS

## 2020-05-27 MED ORDER — CEFAZOLIN SODIUM-DEXTROSE 2-4 GM/100ML-% IV SOLN
INTRAVENOUS | Status: AC
  Filled 2020-05-27: qty 100

## 2020-05-27 MED ORDER — LIDOCAINE HCL (CARDIAC) PF 100 MG/5ML IV SOSY
PREFILLED_SYRINGE | INTRAVENOUS | Status: DC | PRN
  Administered 2020-05-27: 100 mg via INTRAVENOUS

## 2020-05-27 MED ORDER — HYDROMORPHONE HCL 1 MG/ML IJ SOLN
INTRAMUSCULAR | Status: AC
  Administered 2020-05-27: 0.5 mg via INTRAVENOUS
  Filled 2020-05-27: qty 1

## 2020-05-27 MED ORDER — FAMOTIDINE 20 MG PO TABS
ORAL_TABLET | ORAL | Status: AC
  Administered 2020-05-27: 20 mg via ORAL
  Filled 2020-05-27: qty 1

## 2020-05-27 MED ORDER — OXYCODONE HCL 5 MG PO TABS
5.0000 mg | ORAL_TABLET | Freq: Once | ORAL | Status: AC | PRN
Start: 2020-05-27 — End: 2020-05-27
  Administered 2020-05-27: 5 mg via ORAL

## 2020-05-27 MED ORDER — EPINEPHRINE PF 1 MG/ML IJ SOLN
INTRAMUSCULAR | Status: DC | PRN
  Administered 2020-05-27: 4 mL via INTRAMUSCULAR

## 2020-05-27 MED ORDER — KETAMINE HCL 10 MG/ML IJ SOLN
INTRAMUSCULAR | Status: DC | PRN
  Administered 2020-05-27: 20 mg via INTRAVENOUS
  Administered 2020-05-27: 10 mg via INTRAVENOUS
  Administered 2020-05-27: 20 mg via INTRAVENOUS

## 2020-05-27 MED ORDER — PROPOFOL 10 MG/ML IV BOLUS
INTRAVENOUS | Status: AC
  Filled 2020-05-27: qty 20

## 2020-05-27 MED ORDER — LIDOCAINE HCL 1 % IJ SOLN
INTRAMUSCULAR | Status: DC | PRN
  Administered 2020-05-27: 3 mL

## 2020-05-27 MED ORDER — ONDANSETRON HCL 4 MG/2ML IJ SOLN
INTRAMUSCULAR | Status: AC
  Filled 2020-05-27: qty 2

## 2020-05-27 MED ORDER — NEOMYCIN-POLYMYXIN B GU 40-200000 IR SOLN
Status: AC
  Filled 2020-05-27: qty 4

## 2020-05-27 MED ORDER — EPINEPHRINE PF 1 MG/ML IJ SOLN
INTRAMUSCULAR | Status: AC
  Filled 2020-05-27: qty 4

## 2020-05-27 MED ORDER — HYDROMORPHONE HCL 1 MG/ML IJ SOLN
INTRAMUSCULAR | Status: AC
  Filled 2020-05-27: qty 1

## 2020-05-27 MED ORDER — ONDANSETRON HCL 4 MG PO TABS: 4.0000 mg | ORAL_TABLET | Freq: Three times a day (TID) | ORAL | 0 refills | Status: AC | PRN

## 2020-05-27 MED ORDER — OXYCODONE HCL 5 MG/5ML PO SOLN: 5.0000 mg | Freq: Once | ORAL | Status: AC | PRN

## 2020-05-27 MED ORDER — SEVOFLURANE IN SOLN
RESPIRATORY_TRACT | Status: AC
  Filled 2020-05-27: qty 250

## 2020-05-27 MED ORDER — LIDOCAINE HCL (PF) 1 % IJ SOLN
INTRAMUSCULAR | Status: AC
  Filled 2020-05-27: qty 30

## 2020-05-27 MED ORDER — PROPOFOL 10 MG/ML IV BOLUS
INTRAVENOUS | Status: DC | PRN
  Administered 2020-05-27: 200 mg via INTRAVENOUS
  Administered 2020-05-27: 70 mg via INTRAVENOUS

## 2020-05-27 MED ORDER — NEOMYCIN-POLYMYXIN B GU 40-200000 IR SOLN
Status: DC | PRN
  Administered 2020-05-27: 4 mL

## 2020-05-27 MED ORDER — OXYCODONE HCL 5 MG PO TABS: 5.0000 mg | ORAL_TABLET | ORAL | 0 refills | Status: AC | PRN

## 2020-05-27 MED ORDER — FENTANYL CITRATE (PF) 100 MCG/2ML IJ SOLN
25.0000 ug | INTRAMUSCULAR | Status: DC | PRN
  Administered 2020-05-27 (×2): 25 ug via INTRAVENOUS
  Administered 2020-05-27 (×2): 50 ug via INTRAVENOUS

## 2020-05-27 MED ORDER — FENTANYL CITRATE (PF) 100 MCG/2ML IJ SOLN
INTRAMUSCULAR | Status: DC | PRN
  Administered 2020-05-27 (×8): 25 ug via INTRAVENOUS

## 2020-05-27 MED ORDER — CHLORHEXIDINE GLUCONATE 0.12 % MT SOLN
OROMUCOSAL | Status: AC
  Administered 2020-05-27: 15 mL via OROMUCOSAL
  Filled 2020-05-27: qty 15

## 2020-05-27 SURGICAL SUPPLY — 97 items
ADAPTER IRRIG TUBE 2 SPIKE SOL (ADAPTER) ×6 IMPLANT
ANCHOR BUTTON TIGHTROPE ACL RT (Orthopedic Implant) ×3 IMPLANT
ANCHOR PEEK 4.75X19.1 SWLK C (Anchor) ×3 IMPLANT
ANCHOR SUPER #2 ORTHOCORD (Anchor) IMPLANT
BASIN GRAD PLASTIC 32OZ STRL (MISCELLANEOUS) ×3 IMPLANT
BIT DRILL PIN RETRO (DRILL) ×2 IMPLANT
BLADE SURG 15 STRL LF DISP TIS (BLADE) ×2 IMPLANT
BLADE SURG 15 STRL SS (BLADE) ×1
BLADE SURG SZ11 CARB STEEL (BLADE) ×3 IMPLANT
BNDG COHESIVE 4X5 TAN STRL (GAUZE/BANDAGES/DRESSINGS) ×3 IMPLANT
BNDG COHESIVE 6X5 TAN STRL LF (GAUZE/BANDAGES/DRESSINGS) ×3 IMPLANT
BNDG ESMARK 6X12 TAN STRL LF (GAUZE/BANDAGES/DRESSINGS) IMPLANT
BUR RADIUS 3.5 (BURR) ×3 IMPLANT
BUR RADIUS 4.0X18.5 (BURR) ×3 IMPLANT
BUR RADIUS 5.5 (BURR) ×3 IMPLANT
CLEANER CAUTERY TIP 5X5 PAD (MISCELLANEOUS) ×2 IMPLANT
COOLER POLAR GLACIER W/PUMP (MISCELLANEOUS) ×3 IMPLANT
COVER BACK TABLE REUSABLE LG (DRAPES) ×3 IMPLANT
COVER WAND RF STERILE (DRAPES) ×3 IMPLANT
CUFF TOURN SGL QUICK 24 (TOURNIQUET CUFF) ×1
CUFF TOURN SGL QUICK 30 (TOURNIQUET CUFF)
CUFF TRNQT CYL 24X4X16.5-23 (TOURNIQUET CUFF) ×2 IMPLANT
CUFF TRNQT CYL 30X4X21-28X (TOURNIQUET CUFF) IMPLANT
CUTTER DUAL RETRO 10.5 (CUTTER) ×3 IMPLANT
DRAPE 3/4 80X56 (DRAPES) ×6 IMPLANT
DRAPE FLUOR MINI C-ARM 54X84 (DRAPES) ×3 IMPLANT
DRAPE IMP U-DRAPE 54X76 (DRAPES) ×6 IMPLANT
DRAPE INCISE IOBAN 66X45 STRL (DRAPES) IMPLANT
DRAPE POUCH INSTRU U-SHP 10X18 (DRAPES) ×3 IMPLANT
DRAPE U-SHAPE 47X51 STRL (DRAPES) ×3 IMPLANT
DRILL FLIPCUTTER III 6-12 (ORTHOPEDIC DISPOSABLE SUPPLIES) ×2 IMPLANT
DRILL PIN RETRO (DRILL) ×3
DURAPREP 26ML APPLICATOR (WOUND CARE) ×9 IMPLANT
ELECT REM PT RETURN 9FT ADLT (ELECTROSURGICAL) ×3
ELECTRODE REM PT RTRN 9FT ADLT (ELECTROSURGICAL) ×2 IMPLANT
FLIPCUTTER III 6-12 AR-1204FF (ORTHOPEDIC DISPOSABLE SUPPLIES) ×3
GAUZE SPONGE 4X4 12PLY STRL (GAUZE/BANDAGES/DRESSINGS) ×3 IMPLANT
GAUZE XEROFORM 1X8 LF (GAUZE/BANDAGES/DRESSINGS) ×3 IMPLANT
GLOVE SURG 9.0 ORTHO LTXF (GLOVE) ×6 IMPLANT
GLOVE SURG UNDER POLY LF SZ9 (GLOVE) ×3 IMPLANT
GOWN STRL REUS TWL 2XL XL LVL4 (GOWN DISPOSABLE) ×3 IMPLANT
GOWN STRL REUS W/ TWL LRG LVL3 (GOWN DISPOSABLE) ×2 IMPLANT
GOWN STRL REUS W/TWL LRG LVL3 (GOWN DISPOSABLE) ×1
GRADUATE 1200CC STRL 31836 (MISCELLANEOUS) ×3 IMPLANT
GUIDEWIRE 1.2MMX18 (WIRE) ×3 IMPLANT
HANDLE YANKAUER SUCT BULB TIP (MISCELLANEOUS) ×3 IMPLANT
IMP SYS 2ND FIX PEEK 4.75X19.1 (Miscellaneous) ×3 IMPLANT
IMPL SYS 2ND FX PEEK 4.75X19.1 (Miscellaneous) ×2 IMPLANT
IV LACTATED RINGER IRRG 3000ML (IV SOLUTION) ×15
IV LR IRRIG 3000ML ARTHROMATIC (IV SOLUTION) ×30 IMPLANT
KIT TRANSTIBIAL (DISPOSABLE) ×3 IMPLANT
KIT TURNOVER KIT A (KITS) ×3 IMPLANT
LABEL OR SOLS (LABEL) ×3 IMPLANT
MANIFOLD NEPTUNE II (INSTRUMENTS) ×3 IMPLANT
MAT ABSORB  FLUID 56X50 GRAY (MISCELLANEOUS) ×2
MAT ABSORB FLUID 56X50 GRAY (MISCELLANEOUS) ×4 IMPLANT
NDL SAFETY ECLIPSE 18X1.5 (NEEDLE) ×2 IMPLANT
NEEDLE FILTER BLUNT 18X 1/2SAF (NEEDLE) ×1
NEEDLE FILTER BLUNT 18X1 1/2 (NEEDLE) ×2 IMPLANT
NEEDLE HYPO 18GX1.5 SHARP (NEEDLE) ×1
NEEDLE HYPO 22GX1.5 SAFETY (NEEDLE) ×3 IMPLANT
NEEDLE MAYO 6 CRC TAPER PT (NEEDLE) IMPLANT
PACK ARTHROSCOPY KNEE (MISCELLANEOUS) ×3 IMPLANT
PAD ABD DERMACEA PRESS 5X9 (GAUZE/BANDAGES/DRESSINGS) ×6 IMPLANT
PAD CLEANER CAUTERY TIP 5X5 (MISCELLANEOUS) ×1
PAD WRAPON POLAR KNEE (MISCELLANEOUS) ×2 IMPLANT
PENCIL ELECTRO HAND CTR (MISCELLANEOUS) ×3 IMPLANT
SCREW BIO FULL THREADED 10X28 (Screw) ×3 IMPLANT
SCREW INTERFERENCE CANN 11X28M (Screw) ×3 IMPLANT
SET TUBE SUCT SHAVER OUTFL 24K (TUBING) ×3 IMPLANT
SET TUBE TIP INTRA-ARTICULAR (MISCELLANEOUS) ×3 IMPLANT
STRIP CLOSURE SKIN 1/2X4 (GAUZE/BANDAGES/DRESSINGS) ×6 IMPLANT
SUCTION FRAZIER HANDLE 10FR (MISCELLANEOUS) ×1
SUCTION TUBE FRAZIER 10FR DISP (MISCELLANEOUS) ×2 IMPLANT
SUT 2 FIBERLOOP 20 STRT BLUE (SUTURE) ×6
SUT ETHILON 4-0 (SUTURE) ×1
SUT ETHILON 4-0 FS2 18XMFL BLK (SUTURE) ×2
SUT FIBERSNARE 2 CLSD LOOP (SUTURE) ×3 IMPLANT
SUT FIBERWIRE #2 38 T-5 BLUE (SUTURE) ×6
SUT MNCRL AB 4-0 PS2 18 (SUTURE) ×3 IMPLANT
SUT ORTHOCORD 2X36 W/O NDL (SUTURE) IMPLANT
SUT VIC AB 0 CT1 36 (SUTURE) ×3 IMPLANT
SUT VIC AB 2-0 CT2 27 (SUTURE) IMPLANT
SUT VIC AB 2-0 SH 27 (SUTURE) ×1
SUT VIC AB 2-0 SH 27XBRD (SUTURE) ×2 IMPLANT
SUTURE 2 FIBERLOOP 20 STRT BLU (SUTURE) ×4 IMPLANT
SUTURE ETHLN 4-0 FS2 18XMF BLK (SUTURE) ×2 IMPLANT
SUTURE FIBERWR #2 38 T-5 BLUE (SUTURE) ×4 IMPLANT
SYR 10ML LL (SYRINGE) ×6 IMPLANT
SYR BULB IRRIG 60ML STRL (SYRINGE) ×3 IMPLANT
TAPE UMBIL 1/8X18 RADIOPA (MISCELLANEOUS) ×3 IMPLANT
TOWEL OR 17X26 4PK STRL BLUE (TOWEL DISPOSABLE) ×6 IMPLANT
TUBING ARTHRO INFLOW-ONLY STRL (TUBING) ×3 IMPLANT
TUBING CONNECTING 10 (TUBING) IMPLANT
WAND HAND CNTRL MULTIVAC 90 (MISCELLANEOUS) IMPLANT
WAND WEREWOLF FLOW 90D (MISCELLANEOUS) ×3 IMPLANT
WRAPON POLAR PAD KNEE (MISCELLANEOUS) ×3

## 2020-05-27 NOTE — Anesthesia Postprocedure Evaluation (Signed)
Anesthesia Post Note  Patient: Adam Glenn.  Procedure(s) Performed: LEFT KNEE ARTHROSCOPY WITH ANTERIOR CRUCIATE LIGAMENT (ACL) REPAIR WITH HAMSTRING GRAFT (Left ) LEFT KNEE ARTHROSCOPY WITH MENISCAL REPAIR (Left Knee)  Patient location during evaluation: PACU Anesthesia Type: General Level of consciousness: awake and alert Pain management: pain level controlled Vital Signs Assessment: post-procedure vital signs reviewed and stable Respiratory status: spontaneous breathing, nonlabored ventilation, respiratory function stable and patient connected to nasal cannula oxygen Cardiovascular status: blood pressure returned to baseline and stable Postop Assessment: no apparent nausea or vomiting Anesthetic complications: no   No complications documented.   Last Vitals:  Vitals:   05/27/20 1200 05/27/20 1215  BP: (!) 141/98 (!) 134/91  Pulse: 81   Resp: 12 14  Temp:    SpO2: 100% 95%    Last Pain:  Vitals:   05/27/20 1215  TempSrc:   PainSc: Asleep                 Corinda Gubler

## 2020-05-27 NOTE — H&P (Addendum)
PREOPERATIVE H&P  Chief Complaint: Left Knee ACL Tear and Lateral Meniscus Tear  HPI: Adam Glenn. is a 40 y.o. male who presents for preoperative history and physical with a diagnosis of Left Knee ACL Tear and Lateral Meniscus Tear sustained.  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  Patient has a complete ACL tear and lateral meniscus tear confirmed by MRI.  Symptoms of pain, instability and clinical symptoms are significantly impairing activities his ability to participate in athletic activity.   Patient wished to proceed with the left knee ACL reconstruction and lateral meniscus repair versus partial lateral meniscectomy.  Past Medical History:  Diagnosis Date  . Arthritis   . Asthma    childhood  . Family history of adverse reaction to anesthesia    mother has nausea  . History of kidney stones    Past Surgical History:  Procedure Laterality Date  . HAND EXPLORATION     Social History   Socioeconomic History  . Marital status: Significant Other    Spouse name: Not on file  . Number of children: Not on file  . Years of education: Not on file  . Highest education level: Not on file  Occupational History  . Not on file  Tobacco Use  . Smoking status: Never Smoker  . Smokeless tobacco: Former Neurosurgeon    Types: Engineer, drilling  . Vaping Use: Never used  Substance and Sexual Activity  . Alcohol use: Yes    Comment: OCCASional  . Drug use: No  . Sexual activity: Yes  Other Topics Concern  . Not on file  Social History Narrative  . Not on file   Social Determinants of Health   Financial Resource Strain: Not on file  Food Insecurity: Not on file  Transportation Needs: Not on file  Physical Activity: Not on file  Stress: Not on file  Social Connections: Not on file   Family History  Problem Relation Age of Onset  . Prostate cancer Neg Hx   . Bladder Cancer Neg Hx   . Kidney cancer Neg Hx    No Known  Allergies Prior to Admission medications   Medication Sig Start Date End Date Taking? Authorizing Provider  acetaminophen (TYLENOL) 500 MG tablet Take 1,000 mg by mouth every 6 (six) hours as needed for moderate pain.   Yes [provider]  traMADol (ULTRAM) 50 MG tablet Take 50 mg by mouth every 6 (six) hours as needed.   Yes [provider]     Positive ROS: All other systems have been reviewed and were otherwise negative with the exception of those mentioned in the HPI and as above.  Physical Exam: General: Alert, no acute distress Cardiovascular: Regular rate and rhythm, no murmurs rubs or gallops.  No pedal edema Respiratory: Clear to auscultation bilaterally, no wheezes rales or rhonchi. No cyanosis, no use of accessory musculature GI: No organomegaly, abdomen is soft and non-tender nondistended with positive bowel sounds. Skin: Skin intact, no lesions within the operative field. Neurologic: Sensation intact distally Psychiatric: Patient is competent for consent with normal mood and affect Lymphatic: No cervical lymphadenopathy  MUSCULOSKELETAL: Left knee: Patient has range of motion from -15 to 90 degrees.  Patient has mild anterior laxity on Lachman's and anterior drawer testing.  He has no instability to varus valgus stress testing at 0 and 30 degrees of flexion.  He has tenderness over the lateral  joint line and mild pain with McMurray's testing.  He can perform a straight leg raise without lag.  He has no calf tenderness or lower leg edema.  Palpable pedal pulses, intact sensation light touch and intact motor function distally.  Assessment: Left Knee ACL Tear and Lateral Meniscus Tear  Plan: Plan for Procedure(s): LEFT KNEE ARTHROSCOPY WITH ANTERIOR CRUCIATE LIGAMENT (ACL) REPAIR WITH HAMSTRING GRAFT LEFT KNEE ARTHROSCOPY WITH MENISCAL REPAIR VS. PARTIAL MEDIAL MENISCECTOMY  I have reviewed the details of the operation as well as the postoperative course  with the patient in the office prior to surgery and reviewed again this morning.  A preop history and physical was performed this morning at the bedside.  The left leg was marked according the hospital's quickset of surgery protocol.  I discussed the risks and benefits of surgery. The risks include but are not limited to infection, bleeding, nerve or blood vessel injury, joint stiffness or loss of motion, persistent pain, weakness or instability, retear of the ACL or meniscus, hardware failure and the need for further surgery. Medical risks include but are not limited to DVT and pulmonary embolism, myocardial infarction, stroke, pneumonia, respiratory failure and death. Patient understood these risks and wished to proceed.     Juanell Fairly, MD   05/27/2020 7:49 AM

## 2020-05-27 NOTE — Op Note (Addendum)
05/27/2020  11:47 AM  PATIENT:  Adam Glenn.    PRE-OPERATIVE DIAGNOSIS:  Left Knee Anterior Cruciate Ligament Tear and Lateral Meniscus Tear  POST-OPERATIVE DIAGNOSIS: Left anterior cruciate ligament tear without unstable lateral meniscus tear.  Arthrofibrosis of the left knee  PROCEDURE:   1.  CLOSED MANIPULATION OF LEFT KNEE. 2.  EXTENSIVE ARTHROSCOPIC LYSIS OF ADHESIONS 3.  LEFT KNEE ARTHROSCOPY WITH ANTERIOR CRUCIATE LIGAMENT (ACL) RECONSTRUCTION WITH HAMSTRING AUTOGRAFT   SURGEON:  Juanell Fairly, MD  ANESTHESIA:   General  PREOPERATIVE INDICATIONS:  Adam Glenn. is a  40 y.o. male with a diagnosis of Left Knee Anterior Cruciate Ligament Tear and Lateral Meniscus Tear.  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  Given his young age and high activity level, it was recommended that he have ACL reconstruction with hamstring autograft and possible lateral meniscal repair.  MRI confirmed a complete ACL tear and suggested a lateral meniscus tear involving the posterior horn.  Patient was sent to preop rehabilitation.  The risks benefits and alternatives were discussed with the patient preoperatively including but not limited to the risks of infection, bleeding, nerve or blood vessel injury, knee stiffness/arthrofibrosis, hardware failure, re-tear of the anterior cruciate ligament graft, persistent pain or instability, osteoarthritis and the need for revision surgery.  Medical risks include but are not limited to DVT and pulmonary embolism, stroke, pneumonia, respiratory failure and death. Patient understood these risks and wished to proceed with surgical reconstruction.   OPERATIVE IMPLANTS: Arthrex anterior cruciate ligament tightrope RC, Arthrex biocomposite 11 x 28 mm tibial interference screw and Arthrex swivel lock anchors x2  OPERATIVE FINDINGS: Left knee arthrofibrosis with complete tear of the ACL from its femoral origin.  Patient had scarring of the left  lateral meniscus tear which was not unstable to probing.  OPERATIVE PROCEDURE: Patient was in the preoperative area. The left knee was marked with the word yes according the hospital's correct site of surgery protocol. The patient was brought to the operating room and placed in the supine position. General anesthesia was administered.  2 g of Ancef IV were given for antibiotic prophylaxis. The lower extremity was prepped and draped in usual sterile fashion.   A time out was performed to verify the patient's name, date of birth, medical record number, correct site of surgery correct procedure to be performed. It was also used to verify the patient received antibiotics and all appropriate instruments, implants and radiographs studies were available in the room. Once all in attendance were in agreement case began. A tourniquet was applied to the left upper thigh but was not inflated.  Exam under anesthesia was performed which demonstrated significant stiffness of the left knee including 90 degrees of flexion and -10 to 15 degrees of full extension.  The patient had minimal anterior laxity with Lachman's test and anterior drawer testing.  No instability to varus valgus stress testing at 0 and 30 of flexion. He did not have a significant effusion.  A closed manipulation of the left knee was performed.  Scar tissue could be felt giving way.  Patient achieved approximately 110 to 115 degrees of flexion.  Patient was also able to be brought to approximately -5 to 10 degrees of full extension.  Proposed arthroscopy incisions were drawn out with a surgical marker and pre-injected with 1% lidocaine plain. An 11 blade was used to establish an inferior medial and lateral portals. The medial portal was created under direct visualization using  an 18-gauge spinal needle for localization.   Extensive scar tissue/adhesions were encountered throughout the knee especially in the anterior knee.  Extensive lysis of adhesions  was performed using a [employer] 90 degree werewolf wand.  Once the knee could be better visualized, after lysis of adhesions, a full diagnostic examination of the knee was performed including the suprapatellar pouch, the patella femoral joint, medial lateral gutters, the medial and lateral compartments, the intercondylar notch in the posterior knee.   The anterior cruciate ligament fibers were debrided with a 4.0 resector shaver blade and 90 degree Smith [employer]hew werewolf wand. A 5.5 resector shaver blade was used perform a notchplasty. Once the intercondylar notch was prepped the attention was turned to harvesting the hamstring autografts.  Patient had no evidence of focal chondral lesion or medial meniscus tear with otoscopic examination of the medial compartment.  The patient's left knee was then positioned in a figure-of-four position.  The patient's MRI had suggested a tear of the posterior horn/root.  Examination arthroscopically however revealed abundant scar formation near the root of the posterior horn.  The lateral meniscus was probed and did not demonstrate any instability.  Therefore no debridement or repair was deemed necessary.  The attention was then turned to harvesting the hamstring autograft.  A longitudinal incision was made over the anteromedial proximal tibia. The sartorius fascia was incised with a 15 blade and reflected to reveal the underlying gracilis and semitendinosus. These were harvested using a tendon stripper. They're prepared on the back table. The graft was measured to be 10 mm on the femoral side and 10 mm on the tibial side. The length of the graft was 250 mm. The graft was placed on the Graftmaster table under 15 mmHg of tension and kept moist on the back table until implantation.   The attention was then turned to tunnel creation. The femoral tunnel cutting guide was then placed through the lateral portal. The arthroscope was placed in the medial portal at this point.  The intercondylar distance was measured at 40 mm at. A flip cutter drill guide was advanced into the intercondylar notch. The blade was engaged and the femoral tunnel was created in a retrograde fashion to 35 mm. A fiber stick suture was placed through the femoral tunnel brought out the lateral portal and clamped for later graft passage.   The attention was then turned to tibial tunnel creation. This was done with a fixed angle tibial retro-drill guide. A drill pin was inserted through the anterior tibia and advanced until it engaged the 10.5 mm drill bit. A tibial tunnel was then created in a retrograde fashion. The fiber stick was brought out through the tibial tunnel. The 4 stranded hamstring tibial autograft was then shuttled through the knee using the fiber stick graft. Once the button was flipped on the lateral femoral cortex FluoroScan image was taken to confirm it was laying flat against the lateral cortex of the femur. Once this was confirmed the hamstring graft was advanced into position using the white suture ends of the Arthrex tight rope RC button. The graft was bottomed out into the femoral tunnel. The knee was then cycled 25 times to remove creep. The knee was then flexed approximately 30. An Arthrex bio composite interference screw 11 x 28 mm was then advanced into position with countertraction on the tibial side of the graft and a posterior drawer force directed to the tibia. Once the interference screw was in position, and 2 Arthrex  swivel lock anchors were used to secure the tibial end of the graft sutures as backup fixation.  The patient had a firm endpoint without anterior laxity on Lachman's test. The range of motion was -5-120. Final arthroscopic images of the graft were taken. There was no graft impingement in full extension. The wounds were copiously irrigated. The deep fascia of the anterior tibial incision was closed with interrupted 0 Vicryl.  The of the tibial incision subcutaneous  tissue was closed with a 2-0 Vicryl and the skin was approximated with a running 4-0 Monocryl.  Patient was injected with 30 cc of 0.25% Marcaine plain at the incision sites and intra-articularly to help with postop pain control.  The arthroscopy portal incisions were closed with 4-0 nylon along with the small stab incision over the lateral femur used for placement of the femoral tunnel.  Patient had a dry sterile dressing applied along with Steri-Strips and Xeroform.  Patient had a Polar Care sleeve along with a hinged knee brace locked in extension. The patient was brought to the PACU in stable condition. I was scrubbed and present the entire case and all sharp and instrument counts were correct at conclusion the case.  The patient was awakened and brought to PACU in stable condition. I spoke with the patient's mother in the postop consultation room to let her know that patient was stable in recovery room the case had been performed without complication.  I reviewed the postop instructions with her.

## 2020-05-27 NOTE — Anesthesia Preprocedure Evaluation (Signed)
Anesthesia Evaluation  Patient identified by MRN, date of birth, ID band Patient awake    Reviewed: Allergy & Precautions, NPO status , Patient's Chart, lab work & pertinent test results  History of Anesthesia Complications Negative for: history of anesthetic complications  Airway Mallampati: II  TM Distance: >3 FB Neck ROM: Full    Dental no notable dental hx. (+) Teeth Intact   Pulmonary asthma , neg sleep apnea, neg COPD, Patient abstained from smoking.Not current smoker,  Very mild and well controlled, does not take inhalers   Pulmonary exam normal breath sounds clear to auscultation       Cardiovascular Exercise Tolerance: Good METS(-) hypertension(-) CAD and (-) Past MI negative cardio ROS  (-) dysrhythmias  Rhythm:Regular Rate:Normal - Systolic murmurs    Neuro/Psych negative neurological ROS  negative psych ROS   GI/Hepatic neg GERD  ,(+)     (-) substance abuse  ,   Endo/Other  neg diabetes  Renal/GU negative Renal ROS     Musculoskeletal   Abdominal   Peds  Hematology   Anesthesia Other Findings Past Medical History: No date: Arthritis No date: Asthma     Comment:  childhood No date: Family history of adverse reaction to anesthesia     Comment:  mother has nausea No date: History of kidney stones  Reproductive/Obstetrics                             Anesthesia Physical Anesthesia Plan  ASA: II  Anesthesia Plan: General   Post-op Pain Management:    Induction: Intravenous  PONV Risk Score and Plan: 2 and Ondansetron and Dexamethasone  Airway Management Planned: LMA  Additional Equipment: None  Intra-op Plan:   Post-operative Plan: Extubation in OR  Informed Consent: I have reviewed the patients History and Physical, chart, labs and discussed the procedure including the risks, benefits and alternatives for the proposed anesthesia with the patient or  authorized representative who has indicated his/her understanding and acceptance.     Dental advisory given  Plan Discussed with: CRNA and Surgeon  Anesthesia Plan Comments: (Discussed risks of anesthesia with patient, including PONV, sore throat, lip/dental damage. Rare risks discussed as well, such as cardiorespiratory and neurological sequelae. Patient understands.)        Anesthesia Quick Evaluation

## 2020-05-27 NOTE — Anesthesia Procedure Notes (Signed)
Procedure Name: LMA Insertion Date/Time: 05/27/2020 8:03 AM Performed by: Henrietta Hoover, CRNA Pre-anesthesia Checklist: Patient identified, Patient being monitored, Timeout performed, Emergency Drugs available and Suction available Patient Re-evaluated:Patient Re-evaluated prior to induction Oxygen Delivery Method: Circle system utilized Preoxygenation: Pre-oxygenation with 100% oxygen Induction Type: IV induction Ventilation: Mask ventilation without difficulty LMA: LMA inserted LMA Size: 4.0 Tube type: Oral Number of attempts: 1 Placement Confirmation: positive ETCO2 and breath sounds checked- equal and bilateral Tube secured with: Tape Dental Injury: Teeth and Oropharynx as per pre-operative assessment

## 2020-05-27 NOTE — Discharge Instructions (Addendum)

## 2020-05-27 NOTE — Transfer of Care (Signed)
Immediate Anesthesia Transfer of Care Note  Patient: Adam Glenn.  Procedure(s) Performed: LEFT KNEE ARTHROSCOPY WITH ANTERIOR CRUCIATE LIGAMENT (ACL) REPAIR WITH HAMSTRING GRAFT (Left ) LEFT KNEE ARTHROSCOPY WITH MENISCAL REPAIR (Left Knee)  Patient Location: PACU  Anesthesia Type:General  Level of Consciousness: drowsy  Airway & Oxygen Therapy: Patient Spontanous Breathing and Patient connected to face mask oxygen  Post-op Assessment: Report given to RN and Post -op Vital signs reviewed and stable  Post vital signs: Reviewed and stable  Last Vitals:  Vitals Value Taken Time  BP 138/93 05/27/20 1138  Temp    Pulse 90 05/27/20 1138  Resp 17 05/27/20 1138  SpO2 100 % 05/27/20 1138    Last Pain:  Vitals:   05/27/20 0606  TempSrc: Oral  PainSc: 2          Complications: No complications documented.

## 2020-05-28 ENCOUNTER — Encounter: Payer: Self-pay | Admitting: Orthopedic Surgery
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
## 2023-07-03 ENCOUNTER — Emergency Department
Admission: EM | Admit: 2023-07-03 | Discharge: 2023-07-03 | Disposition: A | Discharge status: Home / Self Care | Attending: Emergency Medicine | Admitting: Emergency Medicine

## 2023-07-03 ENCOUNTER — Other Ambulatory Visit: Payer: Self-pay

## 2023-07-03 ENCOUNTER — Encounter: Payer: Self-pay | Admitting: Emergency Medicine

## 2023-07-03 ENCOUNTER — Emergency Department

## 2023-07-03 DIAGNOSIS — M25511 Pain in right shoulder: Secondary | ICD-10-CM | POA: Insufficient documentation

## 2023-07-03 DIAGNOSIS — J45909 Unspecified asthma, uncomplicated: Secondary | ICD-10-CM | POA: Diagnosis not present

## 2023-07-03 MED ORDER — KETOROLAC TROMETHAMINE 30 MG/ML IJ SOLN
30.0000 mg | Freq: Once | INTRAMUSCULAR | Status: AC
  Administered 2023-07-03: 30 mg via INTRAMUSCULAR
  Filled 2023-07-03: qty 1

## 2023-07-03 MED ORDER — LIDOCAINE 5 % EX PTCH
1.0000 | MEDICATED_PATCH | CUTANEOUS | Status: DC
  Administered 2023-07-03: 1 via TRANSDERMAL
  Filled 2023-07-03: qty 1

## 2023-07-03 MED ORDER — CYCLOBENZAPRINE HCL 5 MG PO TABS: 5.0000 mg | ORAL_TABLET | Freq: Three times a day (TID) | ORAL | 0 refills | Status: AC | PRN

## 2023-07-03 MED ORDER — LIDOCAINE 5 % EX PTCH: 1.0000 | MEDICATED_PATCH | Freq: Two times a day (BID) | CUTANEOUS | 0 refills | Status: AC

## 2023-07-03 NOTE — ED Triage Notes (Signed)
 No answer when called several times from lobby; pt reports wk ago awoke with rt shoulder pain after "sleeping on it wrong"; went to chiropractor 3 xs without relief; ibuprofen 400mg  taken at 1am

## 2023-07-03 NOTE — ED Provider Notes (Signed)
 [facility] Provider Note    Event Date/Time   First MD Initiated Contact with Patient 07/03/23 [phone]     (approximate)   History   Chief Complaint Shoulder Pain   HPI  Adam Glenn. is a 43 y.o. male with past medical history of asthma who presents to the ED complaining of shoulder pain.  Patient reports that 1 week ago he woke up with pain in his right shoulder, especially when lifting his arm up.  He has not noticed any redness or swelling to the shoulder, does state that the pain worsens whenever he tries to use his right arm.  He has been taking Tylenol  and ibuprofen without significant relief, does report breaking his right collarbone in the past.     Physical Exam   Triage Vital Signs: ED Triage Vitals [07/03/23 0431]  Encounter Vitals Group     BP 110/81     Systolic BP Percentile      Diastolic BP Percentile      Pulse Rate 83     Resp 18     Temp 97.6 F (36.4 C)     Temp Source Oral     SpO2 97 %     Weight 200 lb (90.7 kg)     Height 6' (1.829 m)     Head Circumference      Peak Flow      Pain Score 10     Pain Loc      Pain Education      Exclude from Growth Chart     Most recent vital signs: Vitals:   07/03/23 0431  BP: 110/81  Pulse: 83  Resp: 18  Temp: 97.6 F (36.4 C)  SpO2: 97%    Constitutional: Alert and oriented. Eyes: Conjunctivae are normal. Head: Atraumatic. Nose: No congestion/rhinnorhea. Mouth/Throat: Mucous membranes are moist.  Cardiovascular: Normal rate, regular rhythm. Grossly normal heart sounds.  2+ radial pulses bilaterally. Respiratory: Normal respiratory effort.  No retractions. Lungs CTAB. Gastrointestinal: Soft and nontender. No distention. Musculoskeletal: No lower extremity tenderness nor edema.  Diffuse tenderness to palpation of right shoulder with pain upon range of motion, no erythema or edema noted. Neurologic:  Normal speech and language. No gross focal neurologic deficits are  appreciated.    ED Results / Procedures / Treatments   Labs (all labs ordered are listed, but only abnormal results are displayed) Labs Reviewed - No data to display   RADIOLOGY Right shoulder x-ray reviewed and interpreted by me with no fracture or dislocation.  PROCEDURES:  Critical Care performed: No  Procedures   MEDICATIONS ORDERED IN ED: Medications  lidocaine  (LIDODERM ) 5 % 1 patch (1 patch Transdermal Patch Applied 07/03/23 0511)  ketorolac (TORADOL) 30 MG/ML injection 30 mg (30 mg Intramuscular Given 07/03/23 0511)     IMPRESSION / MDM / ASSESSMENT AND PLAN / ED COURSE  I reviewed the triage vital signs and the nursing notes.                              43 y.o. male with past medical history of asthma who presents to the ED complaining of 1 week of right shoulder pain and pain upon range of motion.  Patient's presentation is most consistent with acute complicated illness / injury requiring diagnostic workup.  Differential diagnosis includes, but is not limited to, fracture, dislocation, rotator cuff injury, osteoarthritis, septic arthritis.  Patient nontoxic-appearing and  in no acute distress, vital signs are unremarkable.  No obvious deformity to his right shoulder and he is neurovascularly intact distally.  No findings concerning for septic arthritis, will further assess with x-ray and treat symptomatically with IM Toradol and Lidoderm  patch.  Right shoulder x-ray is unremarkable, pain improved on reassessment.  Patient appropriate for discharge home with outpatient orthopedic follow-up, referral provided.  Patient counseled to return to the ED for new or worsening symptoms.  Patient agrees with plan.      FINAL CLINICAL IMPRESSION(S) / ED DIAGNOSES   Final diagnoses:  Acute pain of right shoulder     Rx / DC Orders   ED Discharge Orders          Ordered    lidocaine  (LIDODERM ) 5 %  Every 12 hours        07/03/23 0543    cyclobenzaprine (FLEXERIL) 5  MG tablet  3 times daily PRN        07/03/23 0543             Note:  This document was prepared using Dragon voice recognition software and may include unintentional dictation errors.   Twilla Galea, MD 07/03/23 [phone]

## 2023-07-03 NOTE — ED Notes (Signed)
 Pt to imaging
